# Patient Record
Sex: Female | Born: 1970 | Race: White | Hispanic: No | Marital: Married
Health system: Southern US, Community
[De-identification: ages and names within clinical notes are randomized; demographics above are authoritative.]

## PROBLEM LIST (undated history)

## (undated) DIAGNOSIS — I10 Essential (primary) hypertension: Secondary | ICD-10-CM

## (undated) DIAGNOSIS — E079 Disorder of thyroid, unspecified: Secondary | ICD-10-CM

---

## 2017-09-15 ENCOUNTER — Emergency Department (HOSPITAL_COMMUNITY)
Admission: EM | Admit: 2017-09-15 | Discharge: 2017-09-15 | Disposition: A | Discharge status: Home / Self Care | Payer: 59 | Attending: Emergency Medicine | Admitting: Emergency Medicine

## 2017-09-15 ENCOUNTER — Encounter (HOSPITAL_COMMUNITY): Payer: Self-pay

## 2017-09-15 ENCOUNTER — Emergency Department (HOSPITAL_COMMUNITY): Payer: 59

## 2017-09-15 DIAGNOSIS — I10 Essential (primary) hypertension: Secondary | ICD-10-CM | POA: Diagnosis not present

## 2017-09-15 DIAGNOSIS — Z79899 Other long term (current) drug therapy: Secondary | ICD-10-CM | POA: Insufficient documentation

## 2017-09-15 DIAGNOSIS — R200 Anesthesia of skin: Secondary | ICD-10-CM

## 2017-09-15 DIAGNOSIS — E079 Disorder of thyroid, unspecified: Secondary | ICD-10-CM | POA: Diagnosis not present

## 2017-09-15 HISTORY — DX: Disorder of thyroid, unspecified: E07.9

## 2017-09-15 HISTORY — DX: Essential (primary) hypertension: I10

## 2017-09-15 LAB — I-STAT CHEM 8, ED
BUN: 14 mg/dL (ref 6–20)
Calcium, Ion: 1.17 mmol/L (ref 1.15–1.40)
Chloride: 105 mmol/L (ref 98–111)
Creatinine, Ser: 0.9 mg/dL (ref 0.44–1.00)
Glucose, Bld: 192 mg/dL — ABNORMAL HIGH (ref 70–99)
HEMATOCRIT: 39 % (ref 36.0–46.0)
HEMOGLOBIN: 13.3 g/dL (ref 12.0–15.0)
Potassium: 3.5 mmol/L (ref 3.5–5.1)
SODIUM: 139 mmol/L (ref 135–145)
TCO2: 22 mmol/L (ref 22–32)

## 2017-09-15 LAB — DIFFERENTIAL
ABS IMMATURE GRANULOCYTES: 0 10*3/uL (ref 0.0–0.1)
Basophils Absolute: 0 10*3/uL (ref 0.0–0.1)
Basophils Relative: 1 %
Eosinophils Absolute: 0.1 10*3/uL (ref 0.0–0.7)
Eosinophils Relative: 1 %
Immature Granulocytes: 0 %
LYMPHS ABS: 1.8 10*3/uL (ref 0.7–4.0)
LYMPHS PCT: 30 %
Monocytes Absolute: 0.4 10*3/uL (ref 0.1–1.0)
Monocytes Relative: 7 %
NEUTROS ABS: 3.7 10*3/uL (ref 1.7–7.7)
Neutrophils Relative %: 61 %

## 2017-09-15 LAB — I-STAT TROPONIN, ED: TROPONIN I, POC: 0 ng/mL (ref 0.00–0.08)

## 2017-09-15 LAB — CBC
HEMATOCRIT: 40.4 % (ref 36.0–46.0)
HEMOGLOBIN: 12.8 g/dL (ref 12.0–15.0)
MCH: 28.1 pg (ref 26.0–34.0)
MCHC: 31.7 g/dL (ref 30.0–36.0)
MCV: 88.8 fL (ref 78.0–100.0)
Platelets: 253 10*3/uL (ref 150–400)
RBC: 4.55 MIL/uL (ref 3.87–5.11)
RDW: 14.2 % (ref 11.5–15.5)
WBC: 6 10*3/uL (ref 4.0–10.5)

## 2017-09-15 LAB — COMPREHENSIVE METABOLIC PANEL
ALBUMIN: 4.1 g/dL (ref 3.5–5.0)
ALT: 9 U/L (ref 0–44)
ANION GAP: 12 (ref 5–15)
AST: 14 U/L — AB (ref 15–41)
Alkaline Phosphatase: 49 U/L (ref 38–126)
BILIRUBIN TOTAL: 1.9 mg/dL — AB (ref 0.3–1.2)
BUN: 13 mg/dL (ref 6–20)
CALCIUM: 8.9 mg/dL (ref 8.9–10.3)
CO2: 21 mmol/L — AB (ref 22–32)
CREATININE: 0.98 mg/dL (ref 0.44–1.00)
Chloride: 105 mmol/L (ref 98–111)
GFR calc Af Amer: >60 mL/min (ref >60)
GFR calc non Af Amer: >60 mL/min (ref >60)
GLUCOSE: 197 mg/dL — AB (ref 70–99)
Potassium: 3.5 mmol/L (ref 3.5–5.1)
Sodium: 138 mmol/L (ref 135–145)
TOTAL PROTEIN: 6.7 g/dL (ref 6.5–8.1)

## 2017-09-15 LAB — PROTIME-INR
INR: 1.12
Prothrombin Time: 14.3 seconds (ref 11.4–15.2)

## 2017-09-15 LAB — I-STAT BETA HCG BLOOD, ED (MC, WL, AP ONLY)

## 2017-09-15 LAB — APTT: aPTT: 27 seconds (ref 24–36)

## 2017-09-15 NOTE — ED Notes (Signed)
ED Provider at bedside. 

## 2017-09-15 NOTE — Discharge Instructions (Addendum)
Your lab work, chest x-ray and EKG all look great! Your physical exam was good as well. There were no findings that raised concern for stroke. I recommend that you follow-up with your PCP when you return home if you continue to have this numbness.  Thank you for allowing me to take care of you today. Enjoy your trip to [place]! Drink plenty of water and stay cool.

## 2017-09-15 NOTE — ED Provider Notes (Signed)
[facility] EMERGENCY DEPARTMENT Provider Note  CSN: [id number] Arrival date & time: 09/15/17  8295  History   Chief Complaint Chief Complaint  Patient presents with  . Arm Pain  . Numbness    HPI Heidi Bates is a 47 y.o. female with a medical history of HTN and thyroid disease who presented to the ED for left arm tingling while driving today. She describes being able to feel normal sensation in his arms and legs, but states that touch feels more dull on the left than it does on the right. During this episode in the car, denies headache, facial droop, slurred speech, weakness, dizziness, lightheadedness and syncope. Denies chest pain, diaphoresis, SOB or vision changes. She admits to increased stress and reports being up very early this morning. Denies recent head trauma, falls or injuries. No LOC, AMS or confusion. She denies recent substance or alcohol use.  Patient has not experienced these symptoms before. Denies past stroke symptoms. Distant family history of stroke.  Past Medical History:  Diagnosis Date  . Hypertension   . Thyroid disease     There are no active problems to display for this patient.   History reviewed. No pertinent surgical history.   OB History   None      Home Medications    Prior to Admission medications   Medication Sig Start Date End Date Taking? Authorizing Provider  Levothyroxine Sodium (SYNTHROID PO) Take 1 tablet by mouth daily.   Yes [provider]  LOSARTAN POTASSIUM PO Take 1 tablet by mouth daily.   Yes [provider]    Family History No family history on file.  Social History Social History   Tobacco Use  . Smoking status: Not on file  Substance Use Topics  . Alcohol use: Not on file  . Drug use: Not on file     Allergies   Betadine [povidone iodine] and Penicillins   Review of Systems Review of Systems  Constitutional: Negative for activity change, appetite change, chills and  fever.  HENT: Negative.   Eyes: Negative for visual disturbance.  Respiratory: Negative for chest tightness and shortness of breath.   Cardiovascular: Negative for chest pain, palpitations and leg swelling.  Gastrointestinal: Negative.   Genitourinary: Negative.   Musculoskeletal: Negative.   Skin: Negative.   Neurological: Positive for numbness. Negative for dizziness, tremors, syncope, speech difficulty, weakness, light-headedness and headaches.  Psychiatric/Behavioral: Negative for confusion and decreased concentration.     Physical Exam Updated Vital Signs BP 133/82   Pulse 79   Temp 98.2 F (36.8 C) (Oral)   Resp 16   LMP 09/15/2017   SpO2 99%   Physical Exam  Constitutional: She is oriented to person, place, and time. Vital signs are normal. She appears well-developed and well-nourished. She is cooperative. No distress.  HENT:  Head: Normocephalic and atraumatic.  Mouth/Throat: Uvula is midline, oropharynx is clear and moist and mucous membranes are normal.  Eyes: Pupils are equal, round, and reactive to light. Conjunctivae, EOM and lids are normal.  Neck: Normal range of motion and full passive range of motion without pain. Neck supple.  Cardiovascular: Normal rate, regular rhythm, normal heart sounds, intact distal pulses and normal pulses. PMI is not displaced. Exam reveals no gallop.  No murmur heard. Pulses:      Dorsalis pedis pulses are 2+ on the right side, and 2+ on the left side.       Posterior tibial pulses are 2+ on the right  side, and 2+ on the left side.  Pulmonary/Chest: Effort normal and breath sounds normal.  Musculoskeletal: Normal range of motion.  Neurological: She is alert and oriented to person, place, and time. She has normal strength and normal reflexes. No cranial nerve deficit or sensory deficit. She exhibits normal muscle tone. Gait normal. GCS eye subscore is 4. GCS verbal subscore is 5. GCS motor subscore is 6.  Reflex Scores:      Tricep  reflexes are 2+ on the right side and 2+ on the left side.      Bicep reflexes are 2+ on the right side and 2+ on the left side.      Brachioradialis reflexes are 2+ on the right side and 2+ on the left side.      Patellar reflexes are 2+ on the right side and 2+ on the left side.      Achilles reflexes are 2+ on the right side and 2+ on the left side. Sensation intact in upper and lower extremities bilaterally.   Skin: Skin is warm. Capillary refill takes less than 2 seconds. She is not diaphoretic. No pallor.  Psychiatric: She has a normal mood and affect. Her speech is normal and behavior is normal. Judgment and thought content normal. Cognition and memory are normal. She is attentive.  Nursing note and vitals reviewed.    ED Treatments / Results  Labs (all labs ordered are listed, but only abnormal results are displayed) Labs Reviewed  COMPREHENSIVE METABOLIC PANEL - Abnormal; Notable for the following components:      Result Value   CO2 21 (*)    Glucose, Bld 197 (*)    AST 14 (*)    Total Bilirubin 1.9 (*)    All other components within normal limits  I-STAT CHEM 8, ED - Abnormal; Notable for the following components:   Glucose, Bld 192 (*)    All other components within normal limits  PROTIME-INR  APTT  CBC  DIFFERENTIAL  I-STAT TROPONIN, ED  I-STAT BETA HCG BLOOD, ED ([facility], [facility], [facility] ONLY)  CBG MONITORING, ED    EKG EKG Interpretation  Date/Time:  Sunday September 15 2017 09:13:36 EDT Ventricular Rate:  95 PR Interval:  136 QRS Duration: 82 QT Interval:  362 QTC Calculation: 454 R Axis:   53 Text Interpretation:  Normal sinus rhythm Nonspecific ST and T wave abnormality Abnormal ECG no prior to compare with Confirmed by Meridee ScoreButler, Michael [id number]) on 09/15/2017 11:27:09 AM   Radiology Dg Chest 2 View  Result Date: 09/15/2017 CLINICAL DATA:  Arm pain, chest pain, paresthesias EXAM: CHEST - 2 VIEW COMPARISON:  None. FINDINGS: The heart size and mediastinal contours are  within normal limits. Both lungs are clear. The visualized skeletal structures are unremarkable. IMPRESSION: No active cardiopulmonary disease. Electronically Signed   By: Judie PetitM.  Shick M.D.   On: 09/15/2017 10:15    Procedures Procedures (including critical care time)  Medications Ordered in ED Medications - No data to display   Initial Impression / Assessment and Plan / ED Course  Triage vital signs and the nursing notes have been reviewed.  Pertinent labs & imaging results that were available during care of the patient were reviewed and considered in medical decision making (see chart for details).  Clinical Course as of Sep 15 1212  Sun Sep 15, 2017  1136 EKG showed NSR. No ST elevations/depressions or signs of acute ischemia or infarct. This is reassuring in combination with negative troponin which assists in evaluating  and ruling out an acute cardiac process. CXR normal.   [GM]  1202 Patient's blood work is unremarkable. Normal clotting times.   [GM]    Clinical Course User Index [GM] Mortis, Sharyon Medicus, PA-C   Patient presents in no acute distress and is well appearing. She reports a resolution in her paresthesias. Her paresthesia complaints are unusual, but not consistent with a stroke. Complete physical exam was performed today to rule out acute cardiac, neuro and pulm causes to this episode.  Complete neuro exam was normal. NIHSS 0. No focal neuro deficits. Patient's sensation is intact bilaterally. No abnormalities in her coordination, gait or balance. This is also reassuring to rule out a CVA/TIA, especially given pt's demographics and lack of significant risk factors.  Patient's history is not consistent with a CVA/TIA. Based on history and physical exam, there are no acute neuro deficits or abnormalities that warrant head imaging today. She has no motor complaints, bowel/bladder dysfunctions or multi-extremity involvement that would suggest spinal cord pathology. Unclear  etiology to hypoesthesia, but emergent etiologies have been evaluated and ruled out today.   Final Clinical Impressions(s) / ED Diagnoses  1. Numbness. Transient nature. Unclear etiology. History and clinical exam not consistent with CVA/TIA or spinal cord pathology. No head imaging indicated today. Advised to follow-up with her PCP. Education provided on s/s that warrant return to the ED.  Dispo: Home. After thorough clinical evaluation, this patient is determined to be medically stable and can be safely discharged with the previously mentioned treatment and/or outpatient follow-up/referral(s). At this time, there are no other apparent medical conditions that require further screening, evaluation or treatment.   Final diagnoses:  Numbness    ED Discharge Orders    None        Reva Bores 09/15/17 1214    Terrilee Files, MD 09/15/17 [phone]

## 2017-09-15 NOTE — ED Triage Notes (Signed)
Pt presents for evaluation of L sided arm pain and numbness. States she felt cold, had tongue numbness. States she feels she may have some vision loss as well.

## 2019-08-02 IMAGING — DX DG CHEST 2V
2 series · 2 of 2 positions shown · non-contrast
Comparison: None.

CLINICAL DATA: Arm pain, chest pain, paresthesias

EXAM:
CHEST - 2 VIEW

[chest pa]
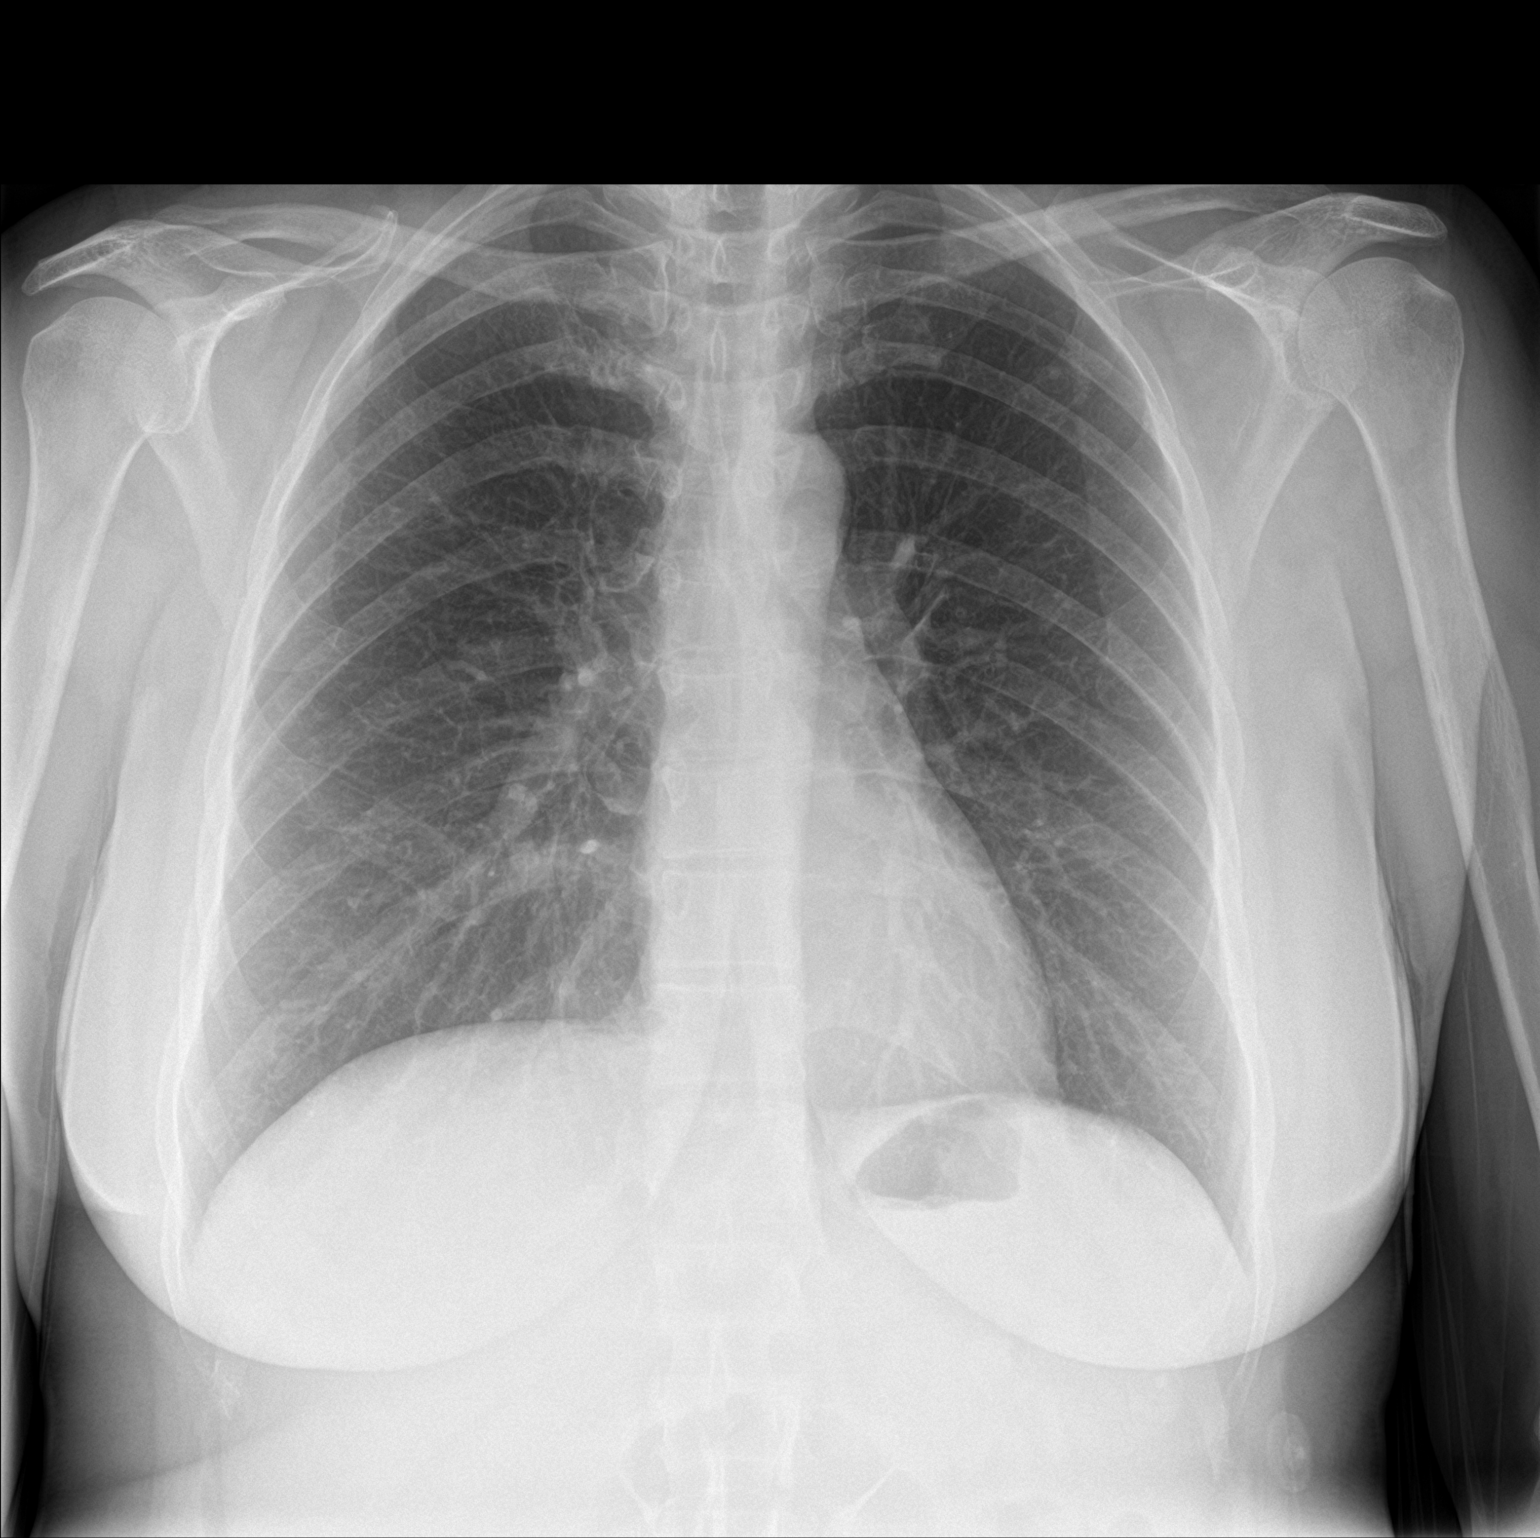

[chest lat]
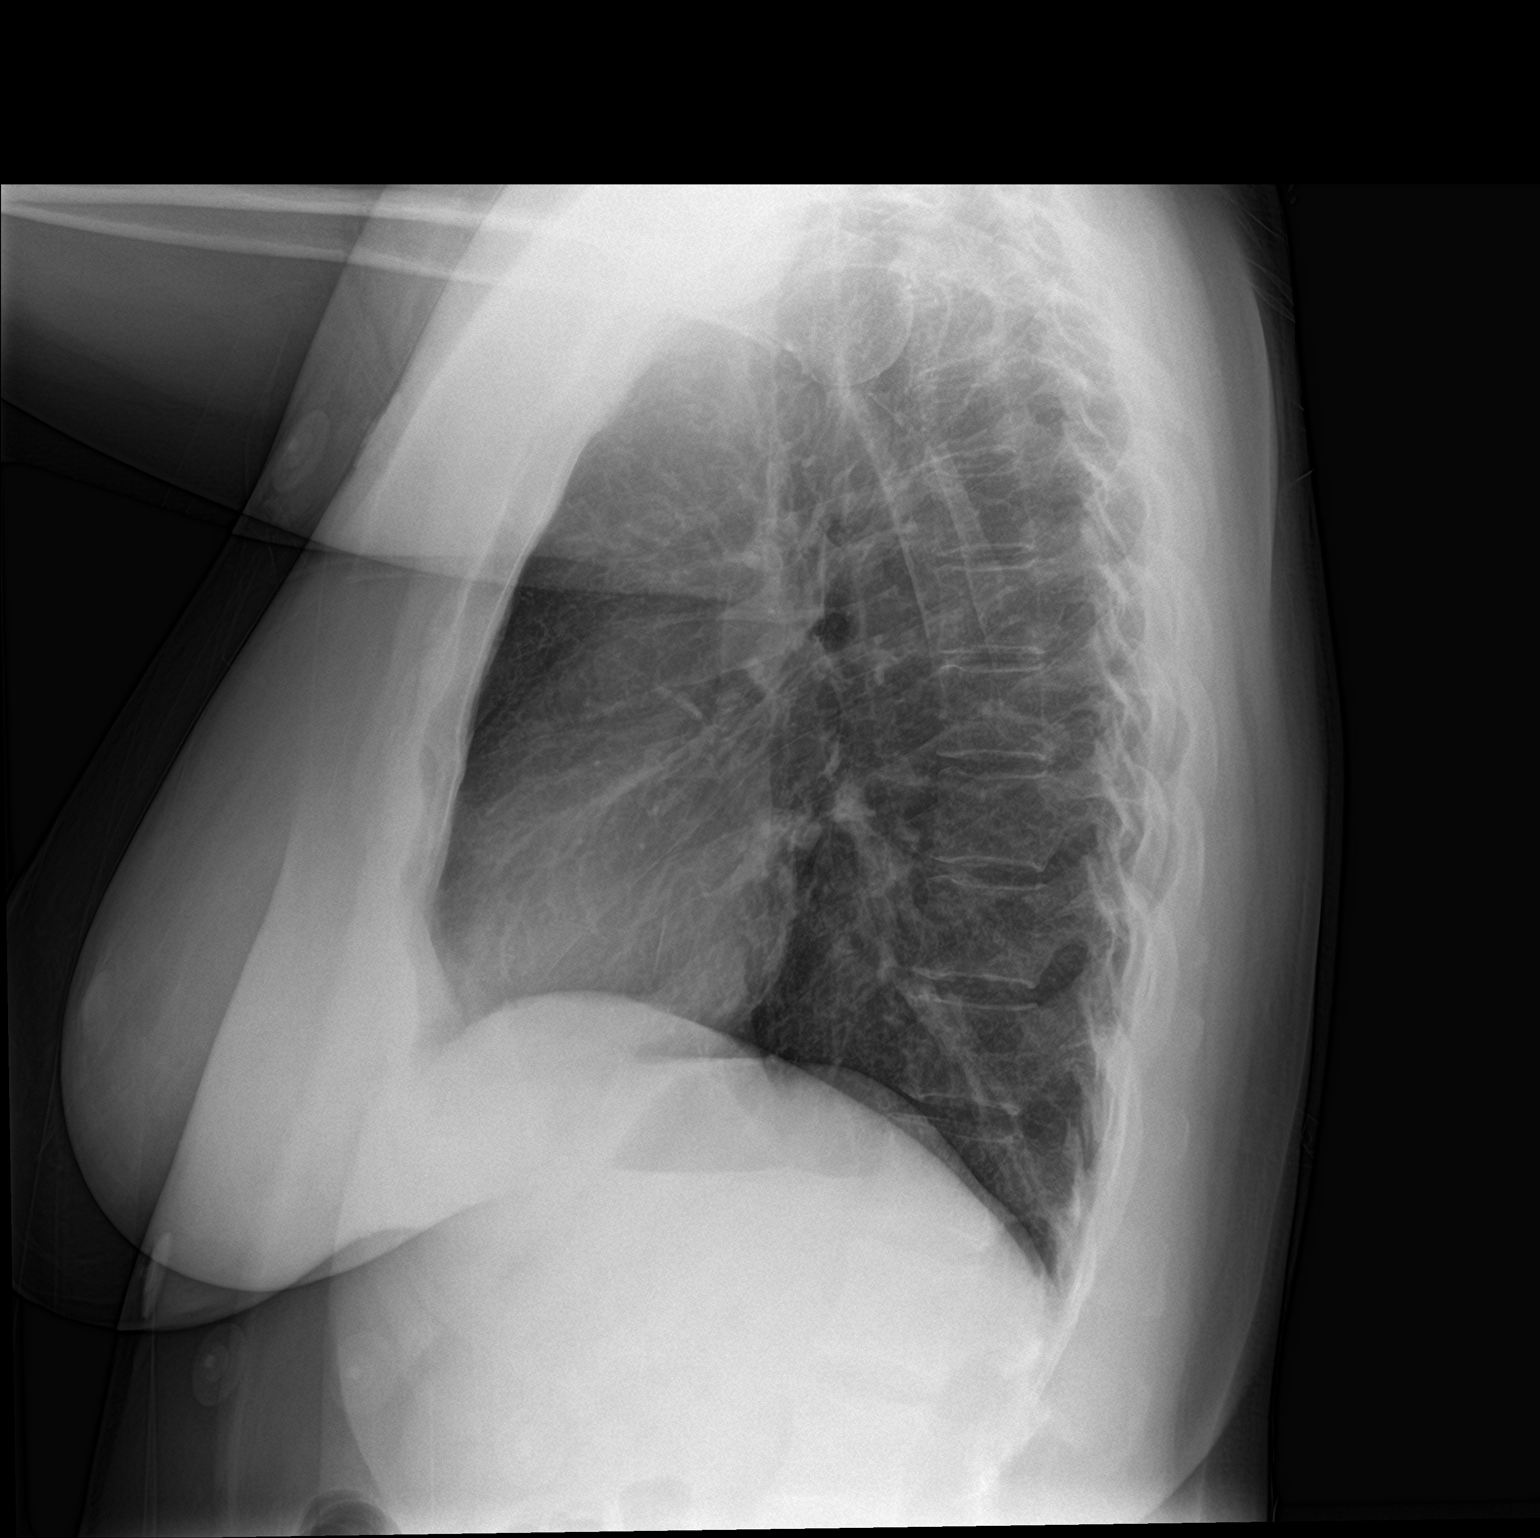

[2 of 2 positions shown; findings below may reference images not displayed]

FINDINGS: The heart size and mediastinal contours are within normal limits.
Both lungs are clear. The visualized skeletal structures are
unremarkable.
IMPRESSION: No active cardiopulmonary disease.
